# Patient Record
Sex: Male | Born: 1990 | Race: White | Hispanic: No | Marital: Single | Smoking: Current every day smoker
Health system: Southern US, Community
[De-identification: ages and names within clinical notes are randomized; demographics above are authoritative.]

## PROBLEM LIST (undated history)

## (undated) DIAGNOSIS — F39 Unspecified mood [affective] disorder: Secondary | ICD-10-CM

---

## 2015-07-07 ENCOUNTER — Emergency Department (HOSPITAL_COMMUNITY): Payer: BLUE CROSS/BLUE SHIELD

## 2015-07-07 ENCOUNTER — Emergency Department (HOSPITAL_COMMUNITY)
Admission: EM | Admit: 2015-07-07 | Discharge: 2015-07-07 | Disposition: A | Discharge status: Home / Self Care | Payer: BLUE CROSS/BLUE SHIELD | Attending: Emergency Medicine | Admitting: Emergency Medicine

## 2015-07-07 ENCOUNTER — Encounter (HOSPITAL_COMMUNITY): Payer: Self-pay | Admitting: Emergency Medicine

## 2015-07-07 DIAGNOSIS — F17210 Nicotine dependence, cigarettes, uncomplicated: Secondary | ICD-10-CM | POA: Insufficient documentation

## 2015-07-07 DIAGNOSIS — N201 Calculus of ureter: Secondary | ICD-10-CM | POA: Insufficient documentation

## 2015-07-07 LAB — COMPREHENSIVE METABOLIC PANEL
ALT: 20 U/L (ref 17–63)
ANION GAP: 12 (ref 5–15)
AST: 25 U/L (ref 15–41)
Albumin: 4.6 g/dL (ref 3.5–5.0)
Alkaline Phosphatase: 67 U/L (ref 38–126)
BUN: 17 mg/dL (ref 6–20)
CHLORIDE: 106 mmol/L (ref 101–111)
CO2: 19 mmol/L — AB (ref 22–32)
Calcium: 9.6 mg/dL (ref 8.9–10.3)
Creatinine, Ser: 1.05 mg/dL (ref 0.61–1.24)
GFR calc non Af Amer: >60 mL/min (ref >60)
Glucose, Bld: 113 mg/dL — ABNORMAL HIGH (ref 65–99)
POTASSIUM: 3.5 mmol/L (ref 3.5–5.1)
SODIUM: 137 mmol/L (ref 135–145)
Total Bilirubin: 0.7 mg/dL (ref 0.3–1.2)
Total Protein: 7.7 g/dL (ref 6.5–8.1)

## 2015-07-07 LAB — CBC WITH DIFFERENTIAL/PLATELET
BASOS ABS: 0.1 10*3/uL (ref 0.0–0.1)
Basophils Relative: 1 %
Eosinophils Absolute: 0.2 10*3/uL (ref 0.0–0.7)
Eosinophils Relative: 2 %
HCT: 46.3 % (ref 39.0–52.0)
HEMOGLOBIN: 15.9 g/dL (ref 13.0–17.0)
LYMPHS ABS: 3.9 10*3/uL (ref 0.7–4.0)
LYMPHS PCT: 33 %
MCH: 31.9 pg (ref 26.0–34.0)
MCHC: 34.3 g/dL (ref 30.0–36.0)
MCV: 93 fL (ref 78.0–100.0)
Monocytes Absolute: 1.1 10*3/uL — ABNORMAL HIGH (ref 0.1–1.0)
Monocytes Relative: 10 %
NEUTROS PCT: 54 %
Neutro Abs: 6.3 10*3/uL (ref 1.7–7.7)
Platelets: 368 10*3/uL (ref 150–400)
RBC: 4.98 MIL/uL (ref 4.22–5.81)
RDW: 12.9 % (ref 11.5–15.5)
WBC: 11.6 10*3/uL — AB (ref 4.0–10.5)

## 2015-07-07 LAB — URINALYSIS, ROUTINE W REFLEX MICROSCOPIC
Bilirubin Urine: NEGATIVE
Glucose, UA: NEGATIVE mg/dL
Ketones, ur: 40 mg/dL — AB
Nitrite: POSITIVE — AB
Protein, ur: >300 mg/dL — AB
SPECIFIC GRAVITY, URINE: 1.025 (ref 1.005–1.030)
pH: 6.5 (ref 5.0–8.0)

## 2015-07-07 LAB — URINE MICROSCOPIC-ADD ON

## 2015-07-07 LAB — LIPASE, BLOOD: LIPASE: 20 U/L (ref 11–51)

## 2015-07-07 MED ORDER — NAPROXEN 500 MG PO TABS: 500.0000 mg | ORAL_TABLET | Freq: Two times a day (BID) | ORAL | Status: AC

## 2015-07-07 MED ORDER — HYDROMORPHONE HCL 1 MG/ML IJ SOLN
1.0000 mg | INTRAMUSCULAR | Status: AC | PRN
  Administered 2015-07-07 (×3): 1 mg via INTRAVENOUS
  Filled 2015-07-07 (×3): qty 1

## 2015-07-07 MED ORDER — SODIUM CHLORIDE 0.9 % IV SOLN
INTRAVENOUS | Status: DC
  Administered 2015-07-07: 16:00:00 via INTRAVENOUS

## 2015-07-07 MED ORDER — KETOROLAC TROMETHAMINE 30 MG/ML IJ SOLN
30.0000 mg | Freq: Once | INTRAMUSCULAR | Status: AC
Start: 2015-07-07 — End: 2015-07-07
  Administered 2015-07-07: 30 mg via INTRAVENOUS
  Filled 2015-07-07: qty 1

## 2015-07-07 MED ORDER — ONDANSETRON HCL 4 MG/2ML IJ SOLN
4.0000 mg | Freq: Once | INTRAMUSCULAR | Status: AC
  Administered 2015-07-07: 4 mg via INTRAVENOUS
  Filled 2015-07-07: qty 2

## 2015-07-07 MED ORDER — PROMETHAZINE HCL 25 MG PO TABS: 25.0000 mg | ORAL_TABLET | Freq: Four times a day (QID) | ORAL | Status: AC | PRN

## 2015-07-07 MED ORDER — OXYCODONE-ACETAMINOPHEN 5-325 MG PO TABS: 1.0000 | ORAL_TABLET | Freq: Four times a day (QID) | ORAL | Status: AC | PRN

## 2015-07-07 NOTE — Discharge Instructions (Signed)
Kidney Stones °Kidney stones (urolithiasis) are deposits that form inside your kidneys. The intense pain is caused by the stone moving through the urinary tract. When the stone moves, the ureter goes into spasm around the stone. The stone is usually passed in the urine.  °CAUSES  °· A disorder that makes certain neck glands produce too much parathyroid hormone (primary hyperparathyroidism). °· A buildup of uric acid crystals, similar to gout in your joints. °· Narrowing (stricture) of the ureter. °· A kidney obstruction present at birth (congenital obstruction). °· Previous surgery on the kidney or ureters. °· Numerous kidney infections. °SYMPTOMS  °· Feeling sick to your stomach (nauseous). °· Throwing up (vomiting). °· Blood in the urine (hematuria). °· Pain that usually spreads (radiates) to the groin. °· Frequency or urgency of urination. °DIAGNOSIS  °· Taking a history and physical exam. °· Blood or urine tests. °· CT scan. °· Occasionally, an examination of the inside of the urinary bladder (cystoscopy) is performed. °TREATMENT  °· Observation. °· Increasing your fluid intake. °· Extracorporeal shock wave lithotripsy--This is a noninvasive procedure that uses shock waves to break up kidney stones. °· Surgery may be needed if you have severe pain or persistent obstruction. There are various surgical procedures. Most of the procedures are performed with the use of small instruments. Only small incisions are needed to accommodate these instruments, so recovery time is minimized. °The size, location, and chemical composition are all important variables that will determine the proper choice of action for you. Talk to your health care provider to better understand your situation so that you will minimize the risk of injury to yourself and your kidney.  °HOME CARE INSTRUCTIONS  °· Drink enough water and fluids to keep your urine clear or pale yellow. This will help you to pass the stone or stone fragments. °· Strain  all urine through the provided strainer. Keep all particulate matter and stones for your health care provider to see. The stone causing the pain may be as small as a grain of salt. It is very important to use the strainer each and every time you pass your urine. The collection of your stone will allow your health care provider to analyze it and verify that a stone has actually passed. The stone analysis will often identify what you can do to reduce the incidence of recurrences. °· Only take over-the-counter or prescription medicines for pain, discomfort, or fever as directed by your health care provider. °· Keep all follow-up visits as told by your health care provider. This is important. °· Get follow-up X-rays if required. The absence of pain does not always mean that the stone has passed. It may have only stopped moving. If the urine remains completely obstructed, it can cause loss of kidney function or even complete destruction of the kidney. It is your responsibility to make sure X-rays and follow-ups are completed. Ultrasounds of the kidney can show blockages and the status of the kidney. Ultrasounds are not associated with any radiation and can be performed easily in a matter of minutes. °· Make changes to your daily diet as told by your health care provider. You may be told to: °¨ Limit the amount of salt that you eat. °¨ Eat 5 or more servings of fruits and vegetables each day. °¨ Limit the amount of meat, poultry, fish, and eggs that you eat. °· Collect a 24-hour urine sample as told by your health care provider. You may need to collect another urine sample every 6-12   months. °SEEK MEDICAL CARE IF: °· You experience pain that is progressive and unresponsive to any pain medicine you have been prescribed. °SEEK IMMEDIATE MEDICAL CARE IF:  °· Pain cannot be controlled with the prescribed medicine. °· You have a fever or shaking chills. °· The severity or intensity of pain increases over 18 hours and is not  relieved by pain medicine. °· You develop a new onset of abdominal pain. °· You feel faint or pass out. °· You are unable to urinate. °  °This information is not intended to replace advice given to you by your health care provider. Make sure you discuss any questions you have with your health care provider. °  °Document Released: 01/11/2005 Document Revised: 10/02/2014 Document Reviewed: 06/14/2012 °Elsevier Interactive Patient Education ©2016 [employer] ° °

## 2015-07-07 NOTE — ED Notes (Signed)
Pt states he woke up and drank some water and began having right flank/lower abd pain with vomiting.  Pt very uncomfortable at triage.

## 2015-07-07 NOTE — ED Provider Notes (Signed)
CSN: [id number]     Arrival date & time 07/07/15  1529 History   First MD Initiated Contact with Patient 07/07/15 1537     Chief Complaint  Patient presents with  . Flank Pain    Patient is a 25 y.o. male presenting with abdominal pain. The history is provided by the patient.  Abdominal Pain Pain location:  RLQ Pain quality: sharp   Pain radiation: right groin and right flank. Pain severity:  Severe Onset quality:  Sudden Duration:  4 hours Timing:  Constant Progression:  Worsening Chronicity:  New Context comment:  Suddenly started this morning when he went to drink something.  Tried to drink water then a beer without relief Relieved by:  Nothing Exacerbated by: Lying still.  Not much better pacing around but it is worse when he is still. Associated symptoms: anorexia, nausea and vomiting   Associated symptoms: no chills, no cough, no diarrhea and no fever   He has never had a kidney stone before but thinks that might be what he has.  History reviewed. No pertinent past medical history. History reviewed. No pertinent past surgical history. History reviewed. No pertinent family history. Social History  Substance Use Topics  . Smoking status: Current Every Day Smoker -- 1.00 packs/day    Types: Cigarettes  . Smokeless tobacco: None  . Alcohol Use: Yes     Comment: occasional    Review of Systems  Constitutional: Negative for fever and chills.  Respiratory: Negative for cough.   Gastrointestinal: Positive for nausea, vomiting, abdominal pain and anorexia. Negative for diarrhea.  All other systems reviewed and are negative.     Allergies  Review of patient's allergies indicates no known allergies.  Home Medications   Prior to Admission medications   Not on File   BP 136/98 mmHg  Pulse 68  Temp(Src) 97.2 F (36.2 C) (Temporal)  Resp 22  Ht 5' 6.5" (1.689 m)  Wt 81.647 kg  BMI 28.62 kg/m2  SpO2 100% Physical Exam  Constitutional: He appears well-developed  and well-nourished. He appears distressed.  Appears uncomfortable  HENT:  Head: Normocephalic and atraumatic.  Right Ear: External ear normal.  Left Ear: External ear normal.  Eyes: Conjunctivae are normal. Right eye exhibits no discharge. Left eye exhibits no discharge. No scleral icterus.  Neck: Neck supple. No tracheal deviation present.  Cardiovascular: Normal rate, regular rhythm and intact distal pulses.   Pulmonary/Chest: Effort normal and breath sounds normal. No stridor. No respiratory distress. He has no wheezes. He has no rales.  Abdominal: Soft. Bowel sounds are normal. He exhibits no distension. There is tenderness in the right lower quadrant. There is no rigidity, no rebound, no guarding and no CVA tenderness. No hernia. Hernia confirmed negative in the right inguinal area and confirmed negative in the left inguinal area.  Genitourinary: Right testis shows no tenderness. Left testis shows no tenderness.  Musculoskeletal: He exhibits no edema or tenderness.  Neurological: He is alert. He has normal strength. No cranial nerve deficit (no facial droop, extraocular movements intact, no slurred speech) or sensory deficit. He exhibits normal muscle tone. He displays no seizure activity. Coordination normal.  Skin: Skin is warm and dry. No rash noted.  Psychiatric: He has a normal mood and affect.  Nursing note and vitals reviewed.   ED Course  Procedures  ED Meds Medications  0.9 %  sodium chloride infusion ( Intravenous New Bag/Given 07/07/15 1557)  ondansetron (ZOFRAN) injection 4 mg (4 mg Intravenous Given 07/07/15  1557)  HYDROmorphone (DILAUDID) injection 1 mg (1 mg Intravenous Given 07/07/15 1648)  ketorolac (TORADOL) 30 MG/ML injection 30 mg (30 mg Intravenous Given 07/07/15 1644)  ondansetron (ZOFRAN) injection 4 mg (4 mg Intravenous Given 07/07/15 1648)    Labs Review Labs Reviewed  COMPREHENSIVE METABOLIC PANEL - Abnormal; Notable for the following:    CO2 19 (*)     Glucose, Bld 113 (*)    All other components within normal limits  CBC WITH DIFFERENTIAL/PLATELET - Abnormal; Notable for the following:    WBC 11.6 (*)    Monocytes Absolute 1.1 (*)    All other components within normal limits  URINALYSIS, ROUTINE W REFLEX MICROSCOPIC (NOT AT [facility]) - Abnormal; Notable for the following:    Color, Urine AMBER (*)    APPearance HAZY (*)    Hgb urine dipstick LARGE (*)    Ketones, ur 40 (*)    Protein, ur >300 (*)    Nitrite POSITIVE (*)    Leukocytes, UA TRACE (*)    All other components within normal limits  URINE MICROSCOPIC-ADD ON - Abnormal; Notable for the following:    Squamous Epithelial / LPF 0-5 (*)    Bacteria, UA FEW (*)    All other components within normal limits  LIPASE, BLOOD    Imaging Review Ct Abdomen Pelvis Wo Contrast  07/07/2015  CLINICAL DATA:  Hematuria, right lower quadrant pain, vomiting EXAM: CT ABDOMEN AND PELVIS WITHOUT CONTRAST TECHNIQUE: Multidetector CT imaging of the abdomen and pelvis was performed following the standard protocol without IV contrast. COMPARISON:  None. FINDINGS: Lower chest:  Lung bases are clear. Hepatobiliary: Unenhanced liver is unremarkable. Gallbladder is unremarkable. No intrahepatic or extrahepatic ductal dilatation. Pancreas: Within normal limits. Spleen: Within normal limits. Adrenals/Urinary Tract: Adrenal glands are within normal limits. Left kidney is within normal limits. Mild right renal edema. Fullness of the right renal collecting system with mild prominence of the right ureter. 3 mm distal right ureteral calculus (series 2/ image 82). Bladder is underdistended but unremarkable. Stomach/Bowel: Stomach is within normal limits. No evidence of bowel obstruction. Normal appendix (series 2/ image 38). Vascular/Lymphatic: No evidence of abdominal aortic aneurysm. No suspicious abdominopelvic lymphadenopathy. Reproductive: Prostate is unremarkable. Other: No abdominopelvic ascites. Musculoskeletal:  Visualized osseous structures are within normal limits. IMPRESSION: 3 mm distal right ureteral calculus. Mild prominence of the right renal collecting system/ureter. Electronically Signed   By: Charline BillsSriyesh  Krishnan M.D.   On: 07/07/2015 17:35   I have personally reviewed and evaluated these images and lab results as part of my medical decision-making.   MDM   Final diagnoses:  Right ureteral stone    The patient's CT scan confirms a ureteral stone.  Patient's symptoms improved with treatment in the emergency room.  Plan on discharge home with medications for pain and nausea. Outpatient follow-up with urology.    Linwood DibblesJon Livier Hendel, MD 07/07/15 [phone]

## 2017-03-03 IMAGING — CT CT ABD-PELV W/O CM
2 of 4 series · 16 of 46 positions shown, 18 images · non-contrast
Comparison: None.

CLINICAL DATA: Hematuria, right lower quadrant pain, vomiting

EXAM:
CT ABDOMEN AND PELVIS WITHOUT CONTRAST
TECHNIQUE: Multidetector CT imaging of the abdomen and pelvis was performed
following the standard protocol without IV contrast.

[Series 2: routine abd pel with · axial · 0.68mm/px · z∈[-529,-59]mm · 13 of 104 slices shown, 15 images]
[im 5/104  soft-tissue]
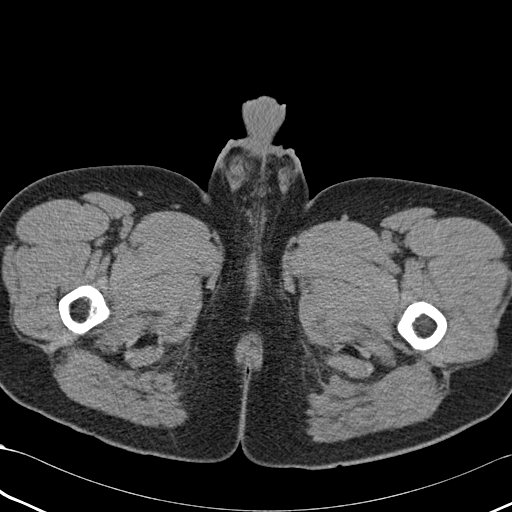
[im 5/104  bone]
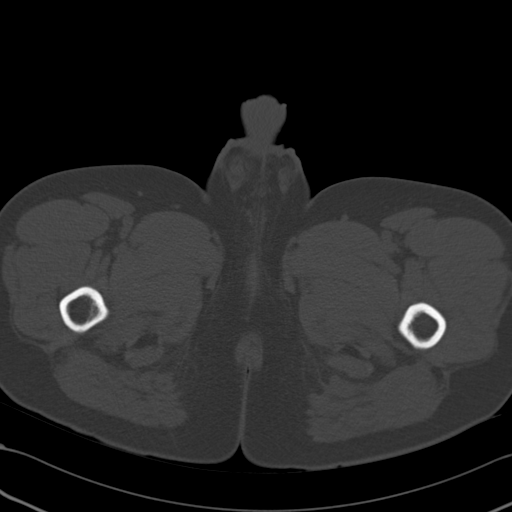
[im 13/104  soft-tissue]
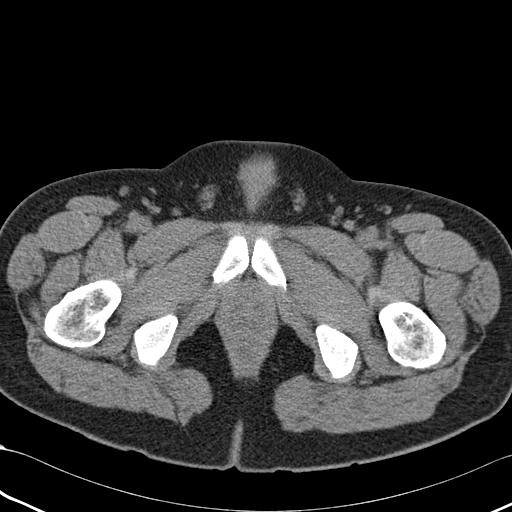
[im 22/104  soft-tissue]
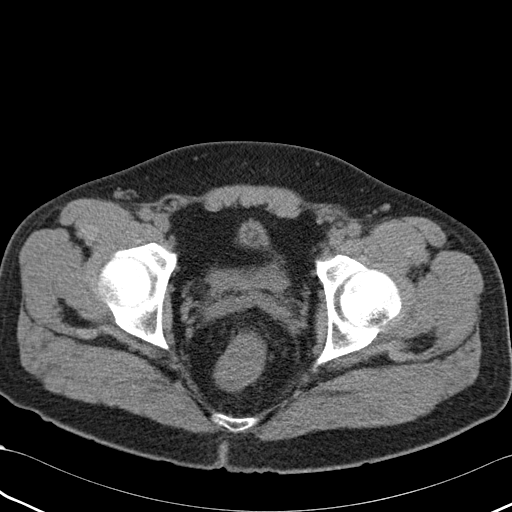
[im 31/104  soft-tissue]
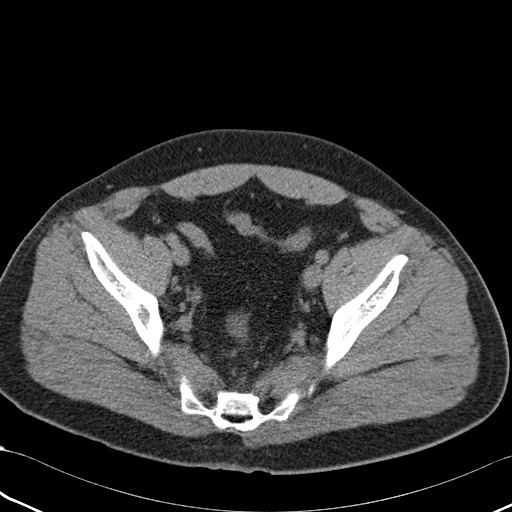
[im 35/104  soft-tissue]
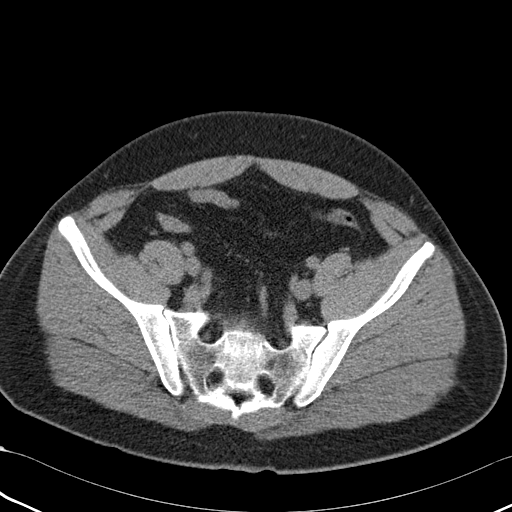
[im 43/104  soft-tissue]
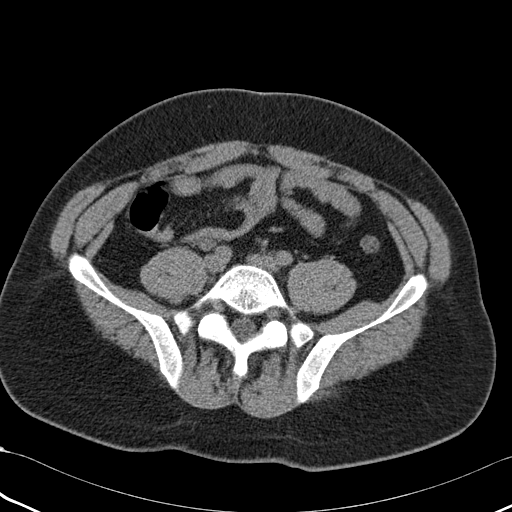
[im 52/104  soft-tissue]
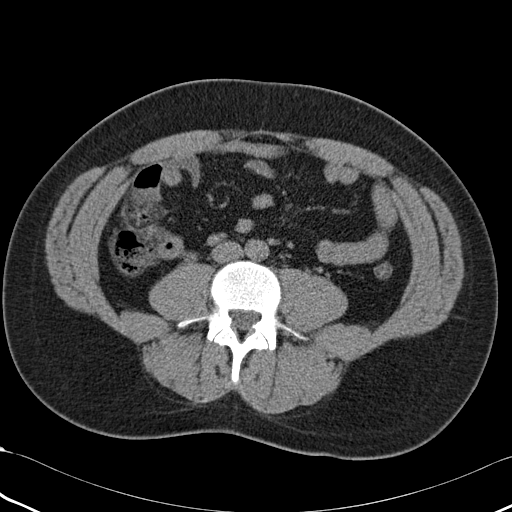
[im 61/104  soft-tissue]
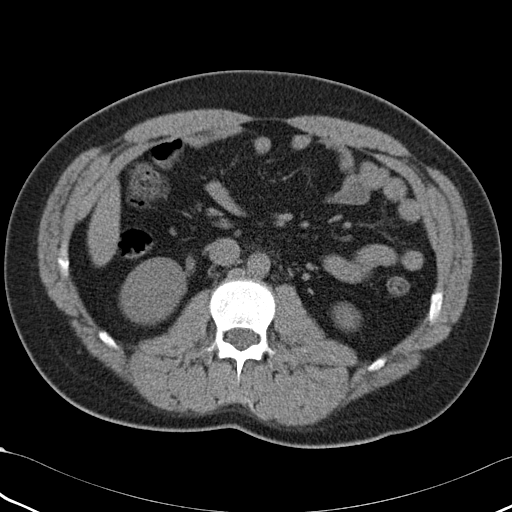
[im 69/104  soft-tissue]
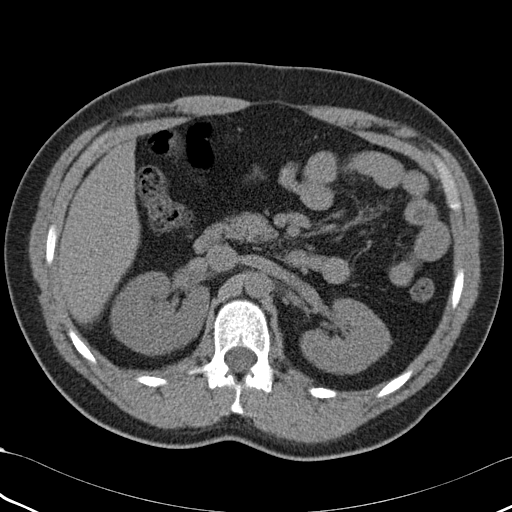
[im 69/104  bone]
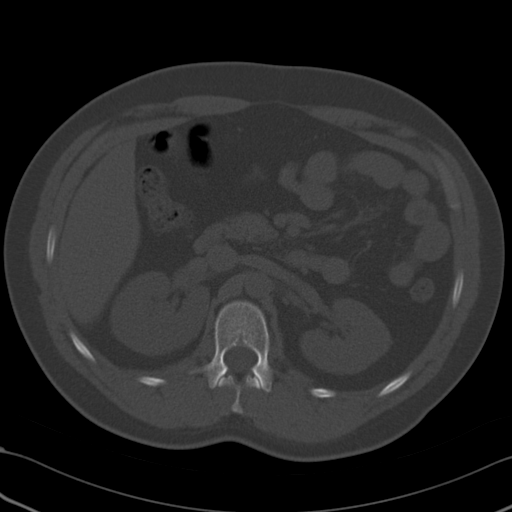
[im 73/104  soft-tissue]
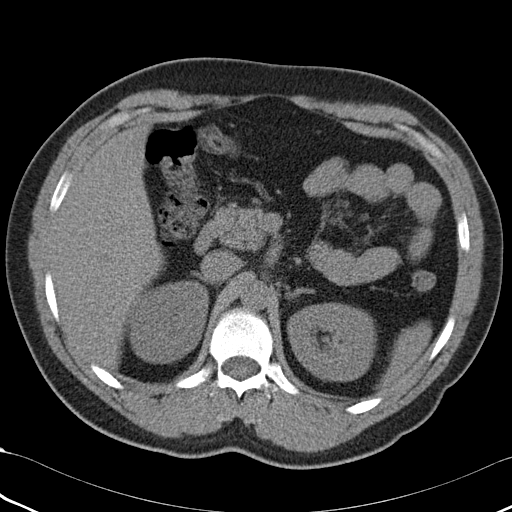
[im 82/104  soft-tissue]
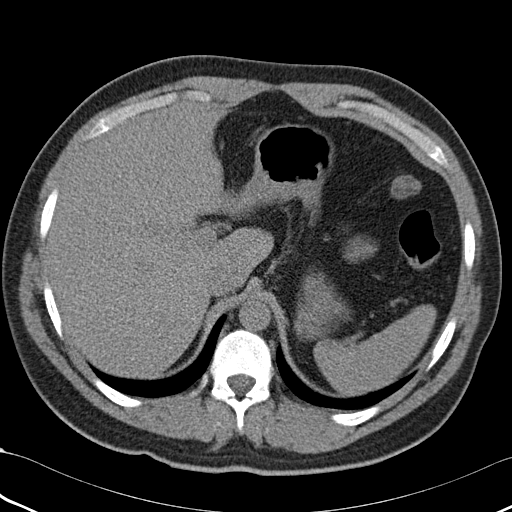
[im 91/104  soft-tissue]
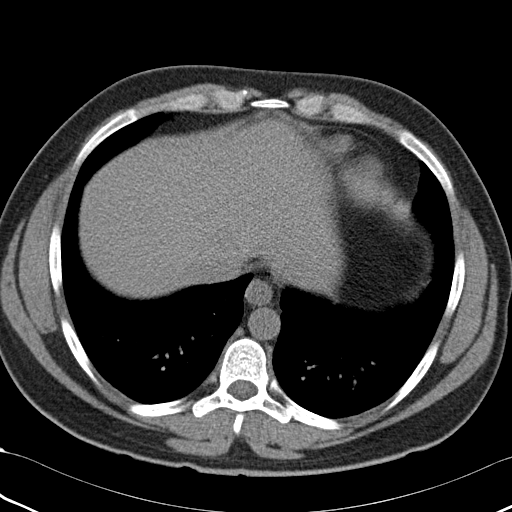
[im 99/104  soft-tissue]
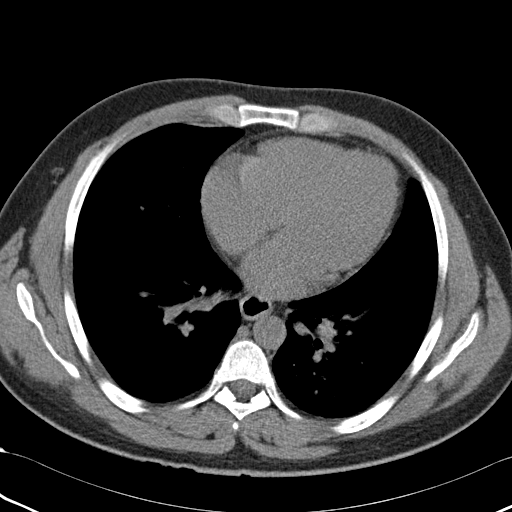

[Series 4: coronal · coronal · 0.71mm/px · 3 of 138 slices shown]
[im 46/138  soft-tissue]
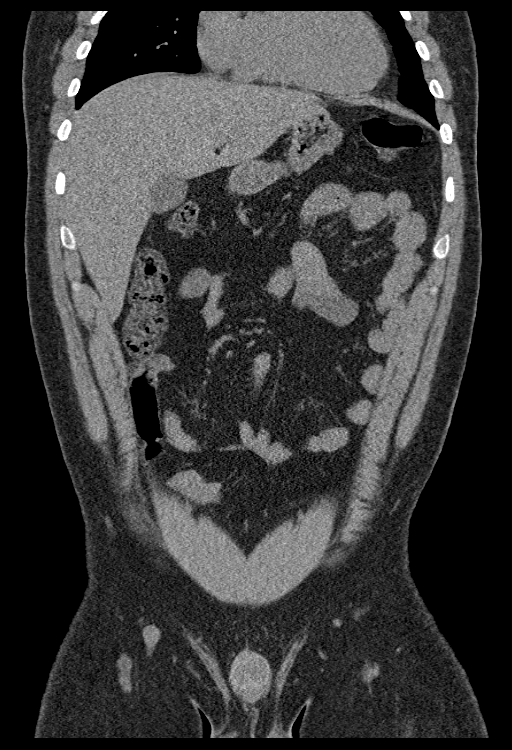
[im 61/138  soft-tissue]
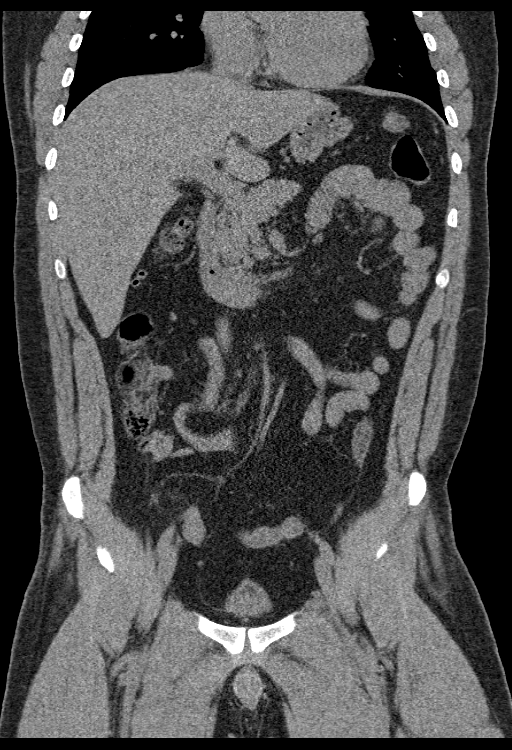
[im 77/138  soft-tissue]
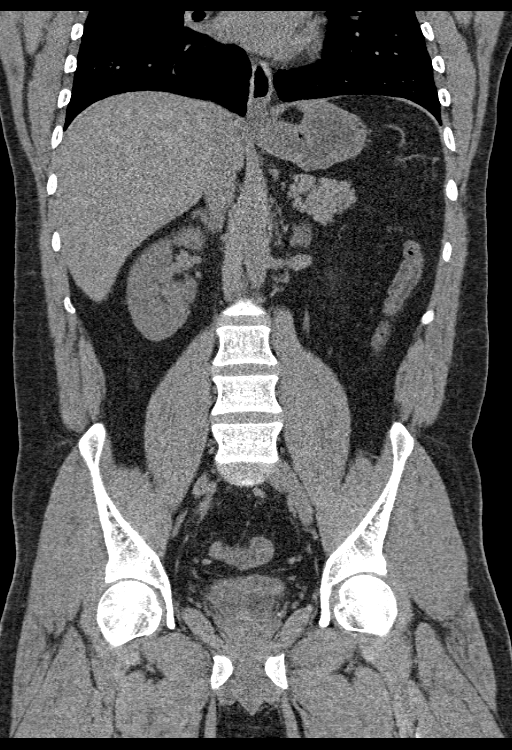

[16 of 46 positions shown; findings below may reference images not displayed]

FINDINGS: Lower chest:  Lung bases are clear.

Hepatobiliary: Unenhanced liver is unremarkable.

Gallbladder is unremarkable. No intrahepatic or extrahepatic ductal
dilatation.

Pancreas: Within normal limits.

Spleen: Within normal limits.

Adrenals/Urinary Tract: Adrenal glands are within normal limits.

Left kidney is within normal limits.

Mild right renal edema. Fullness of the right renal collecting
system with mild prominence of the right ureter. 3 mm distal right
ureteral calculus (series 2/ image 82).

Bladder is underdistended but unremarkable.

Stomach/Bowel: Stomach is within normal limits.

No evidence of bowel obstruction.

Normal appendix (series 2/ image 38).

Vascular/Lymphatic: No evidence of abdominal aortic aneurysm.

No suspicious abdominopelvic lymphadenopathy.

Reproductive: Prostate is unremarkable.

Other: No abdominopelvic ascites.

Musculoskeletal: Visualized osseous structures are within normal
limits.
IMPRESSION: 3 mm distal right ureteral calculus. Mild prominence of the right
renal collecting system/ureter.

## 2021-07-30 ENCOUNTER — Ambulatory Visit (INDEPENDENT_AMBULATORY_CARE_PROVIDER_SITE_OTHER): Payer: BC Managed Care – PPO

## 2021-07-30 ENCOUNTER — Ambulatory Visit
Admission: EM | Admit: 2021-07-30 | Discharge: 2021-07-30 | Disposition: A | Discharge status: Home / Self Care | Payer: BC Managed Care – PPO | Attending: Family Medicine | Admitting: Family Medicine

## 2021-07-30 ENCOUNTER — Encounter: Payer: Self-pay | Admitting: Emergency Medicine

## 2021-07-30 ENCOUNTER — Other Ambulatory Visit: Payer: Self-pay

## 2021-07-30 DIAGNOSIS — M79671 Pain in right foot: Secondary | ICD-10-CM | POA: Diagnosis not present

## 2021-07-30 DIAGNOSIS — M84374A Stress fracture, right foot, initial encounter for fracture: Secondary | ICD-10-CM

## 2021-07-30 HISTORY — DX: Unspecified mood (affective) disorder: F39

## 2021-07-30 NOTE — ED Provider Notes (Signed)
[facility]    CSN: [id number] Arrival date & time: 07/30/21  1750      History   Chief Complaint Chief Complaint  Patient presents with   Foot Pain    HPI Joel Lambert is a 31 y.o. male.   Presenting today with 1.5 weeks of progressively worsening right dorsal foot pain centrally.  States he has been doing a lot of walking at work and thinks this might be contributing to the worsening pain.  Seems to swell throughout the day as he is on his feet more but has not noticed any discoloration, weakness, loss of range of motion.  Some numb tingling sensation when the pain is significant.  Has been trying ice, elevation, ibuprofen with minimal relief.  No past history of known injury that he is aware of.    Past Medical History:  Diagnosis Date   Mood disorder (HCC)     There are no problems to display for this patient.   History reviewed. No pertinent surgical history.     Home Medications    Prior to Admission medications   Medication Sig Start Date End Date Taking? Authorizing Provider  naproxen (NAPROSYN) 500 MG tablet Take 1 tablet (500 mg total) by mouth 2 (two) times daily. 07/07/15   Linwood Dibbles, MD  oxyCODONE-acetaminophen (PERCOCET/ROXICET) 5-325 MG tablet Take 1 tablet by mouth every 6 (six) hours as needed. 07/07/15   Linwood Dibbles, MD  promethazine (PHENERGAN) 25 MG tablet Take 1 tablet (25 mg total) by mouth every 6 (six) hours as needed for nausea or vomiting. 07/07/15   Linwood Dibbles, MD    Family History History reviewed. No pertinent family history.  Social History Social History   Tobacco Use   Smoking status: Every Day    Packs/day: 1.00    Types: Cigarettes  Substance Use Topics   Alcohol use: Yes    Comment: occasional   Drug use: Yes    Types: Marijuana     Allergies   Patient has no known allergies.   Review of Systems Review of Systems Per HPI  Physical Exam Triage Vital Signs ED Triage Vitals  Enc Vitals Group     BP  07/30/21 1804 (!) 145/95     Pulse Rate 07/30/21 1804 84     Resp 07/30/21 1804 18     Temp 07/30/21 1804 98.7 F (37.1 C)     Temp Source 07/30/21 1804 Oral     SpO2 07/30/21 1804 93 %     Weight --      Height --      Head Circumference --      Peak Flow --      Pain Score 07/30/21 1805 0     Pain Loc --      Pain Edu? --      Excl. in GC? --    No data found.  Updated Vital Signs BP (!) 145/95 (BP Location: Right Arm)   Pulse 84   Temp 98.7 F (37.1 C) (Oral)   Resp 18   SpO2 93%   Visual Acuity Right Eye Distance:   Left Eye Distance:   Bilateral Distance:    Right Eye Near:   Left Eye Near:    Bilateral Near:     Physical Exam Vitals and nursing note reviewed.  Constitutional:      Appearance: Normal appearance.  HENT:     Head: Atraumatic.  Eyes:     Extraocular Movements: Extraocular movements intact.  Conjunctiva/sclera: Conjunctivae normal.  Cardiovascular:     Rate and Rhythm: Normal rate and regular rhythm.  Pulmonary:     Effort: Pulmonary effort is normal.     Breath sounds: Normal breath sounds.  Musculoskeletal:        General: Swelling and tenderness present. No deformity. Normal range of motion.     Cervical back: Normal range of motion and neck supple.     Comments: Mild edema to the dorsal midfoot right side, tender to palpation in this area.  Mildly antalgic gait.  Range of motion appears intact with no bony deformity palpable  Skin:    General: Skin is warm and dry.  Neurological:     General: No focal deficit present.     Mental Status: He is oriented to person, place, and time.     Motor: No weakness.     Comments: Right foot neurovascularly intact  Psychiatric:        Mood and Affect: Mood normal.        Thought Content: Thought content normal.        Judgment: Judgment normal.    UC Treatments / Results  Labs (all labs ordered are listed, but only abnormal results are displayed) Labs Reviewed - No data to  display  EKG   Radiology DG Foot Complete Right  Result Date: 07/30/2021 CLINICAL DATA:  1.5 weeks of dorsal right foot pain, swelling. increased walking recently but no known injury EXAM: RIGHT FOOT COMPLETE - 3+ VIEW COMPARISON:  None Available. FINDINGS: There is an oblique lucency along the medial cortex of the distal second metatarsal shaft. There is mild soft tissue swelling. There is minimal first MTP and diffuse interphalangeal joint osteoarthritis. There is a small bone island in the third digit proximal phalanx. Os peroneum. IMPRESSION: Small oblique lucency along the medial cortex of the distal second metatarsal shaft, could reflect a stress fracture or vascular channel. Mild soft tissue swelling. Electronically Signed   By: Caprice Renshaw M.D.   On: 07/30/2021 18:33    Procedures Procedures (including critical care time)  Medications Ordered in UC Medications - No data to display  Initial Impression / Assessment and Plan / UC Course  I have reviewed the triage vital signs and the nursing notes.  Pertinent labs & imaging results that were available during my care of the patient were reviewed by me and considered in my medical decision making (see chart for details).     X-ray of the right foot showing a possible stress fracture in the area of his pain.  Will place in a postop shoe and have him follow-up with podiatry.  Work note given for rest.  Supportive care return precautions reviewed.  Final Clinical Impressions(s) / UC Diagnoses   Final diagnoses:  Right foot pain  Stress fracture of right foot, initial encounter     Discharge Instructions      Your x-ray today of your right foot is showing a possible stress fracture in the area of your pain.  I recommend wearing the postop shoe, limiting your physical activity based on your comfort level in the shoe and following up as soon as possible with [facility].  I will list their phone number below for you to call  and make an appointment.  [address] [place], [place] [address].  Main: [phone]. Monday: 8:00 AM - 5:00 PM.    ED Prescriptions   None    PDMP not reviewed this encounter.  Joel Lambert, [place] 07/30/21 1952

## 2021-07-30 NOTE — Discharge Instructions (Addendum)
Your x-ray today of your right foot is showing a possible stress fracture in the area of your pain.  I recommend wearing the postop shoe, limiting your physical activity based on your comfort level in the shoe and following up as soon as possible with [facility].  I will list their phone number below for you to call and make an appointment.  [address] Joel Lambert [place], [place] [address].  Main: [phone]. Monday: 8:00 AM - 5:00 PM.

## 2021-07-30 NOTE — ED Triage Notes (Signed)
Pt reports top of right foot pain x1 week. 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

## 2021-08-04 ENCOUNTER — Ambulatory Visit: Payer: BC Managed Care – PPO | Admitting: Podiatry

## 2021-08-04 ENCOUNTER — Encounter: Payer: Self-pay | Admitting: Podiatry

## 2021-08-04 ENCOUNTER — Telehealth: Payer: Self-pay | Admitting: Podiatry

## 2021-08-04 DIAGNOSIS — S92324A Nondisplaced fracture of second metatarsal bone, right foot, initial encounter for closed fracture: Secondary | ICD-10-CM | POA: Diagnosis not present

## 2021-08-04 NOTE — Progress Notes (Signed)
  Subjective:  Patient ID: Joel Lambert, male    DOB: [birth year],  MRN: [id number]  Chief Complaint  Patient presents with   Fracture    31 y.o. male presents with the above complaint. History confirmed with patient.  He says this issue developed approximately 2 weeks ago.  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  He developed pain and swelling and went to urgent care.  They took x-rays and put him in a walking boot diagnosed him with a stress fracture.  He did get a surgical shoe that is closed at the front he has he needs a closed shoe for work.  Pain is improving.  He has been icing.  Objective:  Physical Exam: warm, good capillary refill, no trophic changes or ulcerative lesions, normal DP and PT pulses, normal sensory exam, and pain and edema over the distal second metatarsal, good range of motion of the MTPJ's and midfoot, no ecchymosis.   Radiographs: Multiple views x-ray of the right foot: Radiographs taken 7 6 at urgent care show nondisplaced partial fracture of second metatarsal distally on medial cortex, does not complete through the lateral cortex Assessment:   1. Closed nondisplaced fracture of second metatarsal bone of right foot, initial encounter      Plan:  Patient was evaluated and treated and all questions answered.  We reviewed his radiographs in detail together today as well as my clinical exam findings.  We discussed that his fracture is consistent with development of a fracture of the medial cortex due to stress repeated injury.  Suspect this was due to the amount of walking that he had to do a few weeks ago.  Do not think this will require surgical intervention at this point.  I recommended nonoperative treatment with immobilization in the surgical shoe which he already has.  I recommend we repeat his x-rays in 4 weeks.  He may return to work next  week but I do not want him to be on his feet for more than 15 minutes at a time he will require light or seated duty for this.  I expect this restriction will be in effect until mid-to-late August  Return in about 4 weeks (around 09/01/2021) for fracture  follow up right foot (new xrays).

## 2021-08-04 NOTE — Telephone Encounter (Signed)
So patient called back and stated that he needs a more detailed note for his job so that they can determine if he needs to be on light duty or taken out of work.  Please advise  [email or web address] Attention-Elizabeth [email or web address]

## 2021-08-04 NOTE — Telephone Encounter (Signed)
Patient called his work would like the note to be more specific on what restriction he has and what he is allowed to do in between the 15 minutes on his feet at a time?

## 2021-08-05 ENCOUNTER — Encounter: Payer: Self-pay | Admitting: Podiatry

## 2021-08-10 NOTE — Telephone Encounter (Signed)
Patient called and left a vm on the general mailbox over the weekend stating that his job will not accommodate his work restrictions He just wanted to inform Dr. Lilian Kapur.

## 2021-08-11 ENCOUNTER — Ambulatory Visit: Payer: BLUE CROSS/BLUE SHIELD | Admitting: Podiatry

## 2021-09-01 ENCOUNTER — Encounter: Payer: Self-pay | Admitting: Podiatry

## 2021-09-01 ENCOUNTER — Ambulatory Visit (INDEPENDENT_AMBULATORY_CARE_PROVIDER_SITE_OTHER): Payer: BC Managed Care – PPO

## 2021-09-01 ENCOUNTER — Ambulatory Visit: Payer: BC Managed Care – PPO | Admitting: Podiatry

## 2021-09-01 DIAGNOSIS — S92324D Nondisplaced fracture of second metatarsal bone, right foot, subsequent encounter for fracture with routine healing: Secondary | ICD-10-CM | POA: Diagnosis not present

## 2021-09-01 NOTE — Progress Notes (Signed)
  Subjective:  Patient ID: Joel Lambert, male    DOB: 11-18-1990,  MRN: [id number]  Chief Complaint  Patient presents with   Fracture    4 week follow up fracture right foot    31 y.o. male presents with the above complaint. History confirmed with patient.  He is doing well he feels much better after being in the boot since last visit.  Has not returned to work yet  Objective:  Physical Exam: warm, good capillary refill, no trophic changes or ulcerative lesions, normal DP and PT pulses, normal sensory exam, and still some tenderness over the second metatarsal distally   Radiographs: Multiple views x-ray of the right foot: New films taken today show stability of the fracture with bony callus surrounding this Assessment:   1. Closed nondisplaced fracture of second metatarsal bone of right foot with routine healing, subsequent encounter      Plan:  Patient was evaluated and treated and all questions answered.  We reviewed his radiographs in detail together today he has had significant healing since last visit.  He is nearly 5 weeks out since the initial injury.  He is planning to go back to work at the end of the month, I discussed with him that I expect this timeline will still be reasonable.  We discussed the shoes that he wears at work I recommended a carbon fiber insole to be added to his foot and he will get this off [employer] to give him extra stability.  He will stay in the boot until that time.  Discussed that expect the remainder of this to heal uneventfully and if he has any further issues for it he will return to see me.  Return if symptoms worsen or fail to improve.

## 2022-01-22 ENCOUNTER — Ambulatory Visit: Admit: 2022-01-22 | Discharge status: Absent without leave | Payer: BC Managed Care – PPO

## 2022-07-02 ENCOUNTER — Ambulatory Visit (INDEPENDENT_AMBULATORY_CARE_PROVIDER_SITE_OTHER): Payer: BC Managed Care – PPO

## 2022-07-02 ENCOUNTER — Encounter: Payer: Self-pay | Admitting: Podiatry

## 2022-07-02 ENCOUNTER — Other Ambulatory Visit: Payer: Self-pay | Admitting: Podiatry

## 2022-07-02 ENCOUNTER — Ambulatory Visit: Payer: BC Managed Care – PPO | Admitting: Podiatry

## 2022-07-02 DIAGNOSIS — G5762 Lesion of plantar nerve, left lower limb: Secondary | ICD-10-CM | POA: Diagnosis not present

## 2022-07-02 DIAGNOSIS — M216X9 Other acquired deformities of unspecified foot: Secondary | ICD-10-CM | POA: Diagnosis not present

## 2022-07-02 DIAGNOSIS — M778 Other enthesopathies, not elsewhere classified: Secondary | ICD-10-CM | POA: Diagnosis not present

## 2022-07-02 DIAGNOSIS — L84 Corns and callosities: Secondary | ICD-10-CM

## 2022-07-05 NOTE — Progress Notes (Signed)
Subjective:   Patient ID: Joel Lambert, male   DOB: 32 y.o.   MRN: [id number]   HPI Patient presents stating he has had a lot of pain underneath his left foot and he was given a tentative diagnosis of neuroma and has tried different shoes.  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  Patient will be seen back when symptoms occur again may require more advanced treatment
